# Patient Record
Sex: Female | Born: 1995 | Race: Black or African American | Hispanic: No | Marital: Single | Smoking: Never smoker
Health system: Southern US, Community
[De-identification: ages and names within clinical notes are randomized; demographics above are authoritative.]

---

## 2020-03-14 ENCOUNTER — Ambulatory Visit: Payer: Self-pay | Attending: Internal Medicine

## 2020-03-14 DIAGNOSIS — Z23 Encounter for immunization: Secondary | ICD-10-CM

## 2020-03-14 NOTE — Progress Notes (Signed)
   Covid-19 Vaccination Clinic  Name:  Reola Buckles    MRN: [id number] DOB: [birth year]  03/14/2020  Ms. Bednarczyk was observed post Covid-19 immunization for 15 minutes without incident. She was provided with Vaccine Information Sheet and instruction to access the V-Safe system.   Ms. Brzozowski was instructed to call 911 with any severe reactions post vaccine: Marland Kitchen Difficulty breathing  . Swelling of face and throat  . A fast heartbeat  . A bad rash all over body  . Dizziness and weakness   Immunizations Administered    Name Date Dose VIS Date Route   Pfizer COVID-19 Vaccine 03/14/2020  5:03 PM 0.3 mL 01/19/2019 Intramuscular   Manufacturer: [employer], [employer]   Lot: [id number]   NDC: [address]-2

## 2020-04-05 ENCOUNTER — Ambulatory Visit: Payer: Self-pay | Attending: Internal Medicine

## 2020-04-05 DIAGNOSIS — Z23 Encounter for immunization: Secondary | ICD-10-CM

## 2020-04-05 NOTE — Progress Notes (Signed)
   Covid-19 Vaccination Clinic  Name:  Ashley Rivers    MRN: [id number] DOB: 1996-01-26  04/05/2020  [name] was observed post Covid-19 immunization for 15 minutes without incident. She was provided with Vaccine Information Sheet and instruction to access the V-Safe system.   [name] was instructed to call 911 with any severe reactions post vaccine: Marland Kitchen Difficulty breathing  . Swelling of face and throat  . A fast heartbeat  . A bad rash all over body  . Dizziness and weakness   Immunizations Administered    Name Date Dose VIS Date Route   Pfizer COVID-19 Vaccine 04/05/2020  3:11 PM 0.3 mL 01/19/2019 Intramuscular   Manufacturer: [employer], [employer]   Lot: [id number]   NDC: [address]-8

## 2020-08-18 ENCOUNTER — Emergency Department (HOSPITAL_COMMUNITY)
Admission: EM | Admit: 2020-08-18 | Discharge: 2020-08-19 | Disposition: A | Discharge status: Home / Self Care | Payer: 59 | Attending: Emergency Medicine | Admitting: Emergency Medicine

## 2020-08-18 ENCOUNTER — Encounter (HOSPITAL_COMMUNITY): Payer: Self-pay | Admitting: Emergency Medicine

## 2020-08-18 ENCOUNTER — Other Ambulatory Visit: Payer: Self-pay

## 2020-08-18 DIAGNOSIS — N83202 Unspecified ovarian cyst, left side: Secondary | ICD-10-CM | POA: Insufficient documentation

## 2020-08-18 DIAGNOSIS — N83201 Unspecified ovarian cyst, right side: Secondary | ICD-10-CM

## 2020-08-18 DIAGNOSIS — R8290 Unspecified abnormal findings in urine: Secondary | ICD-10-CM

## 2020-08-18 DIAGNOSIS — R109 Unspecified abdominal pain: Secondary | ICD-10-CM | POA: Diagnosis present

## 2020-08-18 LAB — URINALYSIS, ROUTINE W REFLEX MICROSCOPIC
Bilirubin Urine: NEGATIVE
Glucose, UA: NEGATIVE mg/dL
Hgb urine dipstick: NEGATIVE
Ketones, ur: NEGATIVE mg/dL
Nitrite: NEGATIVE
Protein, ur: NEGATIVE mg/dL
Specific Gravity, Urine: 1.016 (ref 1.005–1.030)
WBC, UA: >50 WBC/hpf — ABNORMAL HIGH (ref 0–5)
pH: 6 (ref 5.0–8.0)

## 2020-08-18 LAB — CBC
HCT: 42 % (ref 36.0–46.0)
Hemoglobin: 13.1 g/dL (ref 12.0–15.0)
MCH: 26.7 pg (ref 26.0–34.0)
MCHC: 31.2 g/dL (ref 30.0–36.0)
MCV: 85.5 fL (ref 80.0–100.0)
Platelets: 254 10*3/uL (ref 150–400)
RBC: 4.91 MIL/uL (ref 3.87–5.11)
RDW: 12.5 % (ref 11.5–15.5)
WBC: 9.7 10*3/uL (ref 4.0–10.5)
nRBC: 0 % (ref 0.0–0.2)

## 2020-08-18 LAB — LIPASE, BLOOD: Lipase: 28 U/L (ref 11–51)

## 2020-08-18 LAB — COMPREHENSIVE METABOLIC PANEL
ALT: 18 U/L (ref 0–44)
AST: 16 U/L (ref 15–41)
Albumin: 3.8 g/dL (ref 3.5–5.0)
Alkaline Phosphatase: 43 U/L (ref 38–126)
Anion gap: 9 (ref 5–15)
BUN: 8 mg/dL (ref 6–20)
CO2: 23 mmol/L (ref 22–32)
Calcium: 9.3 mg/dL (ref 8.9–10.3)
Chloride: 102 mmol/L (ref 98–111)
Creatinine, Ser: 0.68 mg/dL (ref 0.44–1.00)
GFR calc Af Amer: >60 mL/min (ref >60)
GFR calc non Af Amer: >60 mL/min (ref >60)
Glucose, Bld: 113 mg/dL — ABNORMAL HIGH (ref 70–99)
Potassium: 4.1 mmol/L (ref 3.5–5.1)
Sodium: 134 mmol/L — ABNORMAL LOW (ref 135–145)
Total Bilirubin: 0.9 mg/dL (ref 0.3–1.2)
Total Protein: 7.6 g/dL (ref 6.5–8.1)

## 2020-08-18 LAB — I-STAT BETA HCG BLOOD, ED (MC, WL, AP ONLY): I-stat hCG, quantitative: <5 m[IU]/mL (ref <5)

## 2020-08-18 NOTE — ED Triage Notes (Signed)
Pt c/o right side abd pain radiating to her chest, pt  Having some nausea with pain.

## 2020-08-19 ENCOUNTER — Emergency Department (HOSPITAL_COMMUNITY): Payer: 59

## 2020-08-19 MED ORDER — IBUPROFEN 800 MG PO TABS
800.0000 mg | ORAL_TABLET | Freq: Once | ORAL | Status: AC
  Administered 2020-08-19: 800 mg via ORAL
  Filled 2020-08-19: qty 1

## 2020-08-19 MED ORDER — CEPHALEXIN 500 MG PO CAPS: 500.0000 mg | ORAL_CAPSULE | Freq: Two times a day (BID) | ORAL | 0 refills | Status: AC

## 2020-08-19 MED ORDER — CEPHALEXIN 250 MG PO CAPS
500.0000 mg | ORAL_CAPSULE | Freq: Once | ORAL | Status: AC
  Administered 2020-08-19: 500 mg via ORAL
  Filled 2020-08-19: qty 2

## 2020-08-19 MED ORDER — IOHEXOL 300 MG/ML  SOLN
100.0000 mL | Freq: Once | INTRAMUSCULAR | Status: AC | PRN
  Administered 2020-08-19: 100 mL via INTRAVENOUS

## 2020-08-19 NOTE — ED Notes (Signed)
Patient transported to CT scan . 

## 2020-08-19 NOTE — ED Provider Notes (Signed)
[facility] EMERGENCY DEPARTMENT Provider Note   CSN: [id number] Arrival date & time: 08/18/20  1256     History Chief Complaint  Patient presents with  . Abdominal Pain    Ashley Rivers is a 24 y.o. female.  Patient presents to the emergency department with a chief complaint of right sided abdominal pain.  She states pain started 2 days ago.  She reports associated nausea but denies any vomiting.  She denies any dysuria or hematuria.  Denies any fevers or chills.  States that she has felt constipated.  Denies any prior abdominal surgeries.  States that her symptoms are worsened with walking and with movement.  The history is provided by the patient. No language interpreter was used.       History reviewed. No pertinent past medical history.  There are no problems to display for this patient.   History reviewed. No pertinent surgical history.   OB History   No obstetric history on file.     No family history on file.  Social History   Tobacco Use  . Smoking status: Never Smoker  . Smokeless tobacco: Never Used  Substance Use Topics  . Alcohol use: Never  . Drug use: Never    Home Medications Prior to Admission medications   Not on File    Allergies    Patient has no known allergies.  Review of Systems   Review of Systems  All other systems reviewed and are negative.   Physical Exam Updated Vital Signs BP 136/86 (BP Location: Left Arm)   Pulse 100   Temp 98.9 F (37.2 C) (Oral)   Resp 18   Ht 5' (1.524 m)   Wt 43.1 kg   SpO2 100%   BMI 18.55 kg/m   Physical Exam Vitals and nursing note reviewed.  Constitutional:      General: She is not in acute distress.    Appearance: She is well-developed.  HENT:     Head: Normocephalic and atraumatic.  Eyes:     Conjunctiva/sclera: Conjunctivae normal.  Cardiovascular:     Rate and Rhythm: Normal rate and regular rhythm.     Heart sounds: No murmur heard.   Pulmonary:       Effort: Pulmonary effort is normal. No respiratory distress.     Breath sounds: Normal breath sounds.  Abdominal:     Palpations: Abdomen is soft.     Tenderness: There is abdominal tenderness in the right upper quadrant and right lower quadrant.  Musculoskeletal:     Cervical back: Neck supple.  Skin:    General: Skin is warm and dry.  Neurological:     Mental Status: She is alert.     ED Results / Procedures / Treatments   Labs (all labs ordered are listed, but only abnormal results are displayed) Labs Reviewed  COMPREHENSIVE METABOLIC PANEL - Abnormal; Notable for the following components:      Result Value   Sodium 134 (*)    Glucose, Bld 113 (*)    All other components within normal limits  URINALYSIS, ROUTINE W REFLEX MICROSCOPIC - Abnormal; Notable for the following components:   APPearance CLOUDY (*)    Leukocytes,Ua LARGE (*)    WBC, UA >50 (*)    Bacteria, UA MANY (*)    All other components within normal limits  LIPASE, BLOOD  CBC  I-STAT BETA HCG BLOOD, ED ([facility], [facility], [facility] ONLY)    EKG EKG Interpretation  Date/Time:  Friday  August 18 2020 13:00:38 EDT Ventricular Rate:  106 PR Interval:  136 QRS Duration: 78 QT Interval:  344 QTC Calculation: 456 R Axis:   89 Text Interpretation: Sinus tachycardia Cannot rule out Anterior infarct , age undetermined Abnormal ECG Confirmed by Gilda Crease [phone]) on 08/19/2020 12:08:42 AM   Radiology No results found.  Procedures Procedures (including critical care time)  Medications Ordered in ED Medications - No data to display  ED Course  I have reviewed the triage vital signs and the nursing notes.  Pertinent labs & imaging results that were available during my care of the patient were reviewed by me and considered in my medical decision making (see chart for details).    MDM Rules/Calculators/A&P                         This patient complains of right-sided abdominal pain, this involves an  extensive number of treatment options, and is a complaint that carries with it a high risk of complications and morbidity.    Differential Dx Appendicitis, cholecystitis, pyelonephritis, UTI, ectopic pregnancy, TOA  Pertinent Labs I ordered, reviewed, and interpreted labs, which included CBC without leukocytosis, no transaminitis, normal electrolytes, pregnancy test negative, urinalysis notable for greater than 50 white blood cells and large leukocyte esterase  Imaging Interpretation I ordered imaging studies which included CT abdomen/pelvis based on focal right-sided tenderness, which showed right sided involuting ovarian cyst.   Medications I ordered medication keflex and ibuprofen for pain and possible UTI.  Reassessments After the interventions stated above, I reevaluated the patient and found the patient stable for discharge.  Consultants none  Plan DC home with keflex for abnormal UA.  OBGYN f/u for ovarian cyst.    Final Clinical Impression(s) / ED Diagnoses Final diagnoses:  Cyst of right ovary  Abnormal urinalysis    Rx / DC Orders ED Discharge Orders         Ordered    cephALEXin (KEFLEX) 500 MG capsule  2 times daily        08/19/20 0215           Roxy Horseman, PA-C 08/19/20 0217    Gilda Crease, MD 08/20/20 [phone]

## 2021-09-02 IMAGING — CT CT ABD-PELV W/ CM
2 of 4 series · 16 of 46 positions shown, 18 images · IV contrast (Omni 300)
Comparison: None.

CLINICAL DATA: Right lower quadrant abdominal pain.  Nausea

EXAM:
CT ABDOMEN AND PELVIS WITH CONTRAST
TECHNIQUE: Multidetector CT imaging of the abdomen and pelvis was performed
using the standard protocol following bolus administration of
intravenous contrast.
CONTRAST:  75 mL OMNIPAQUE IOHEXOL 300 MG/ML  SOLN

[Series 3: a/p w/ 5mm · axial · 0.67mm/px · z∈[+740,+1110]mm · 13 of 82 slices shown, 15 images]
[im 4/82  soft-tissue]
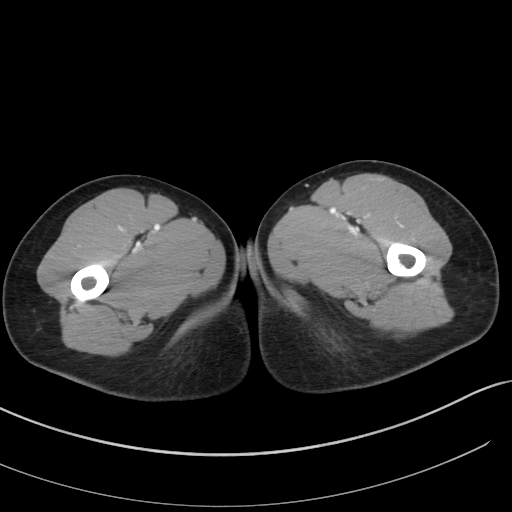
[im 4/82  bone]
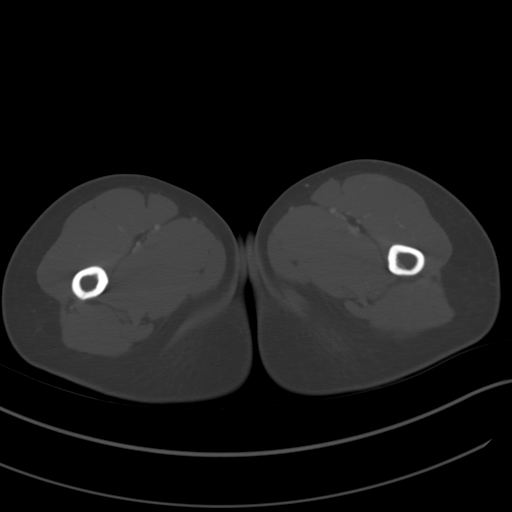
[im 10/82  soft-tissue]
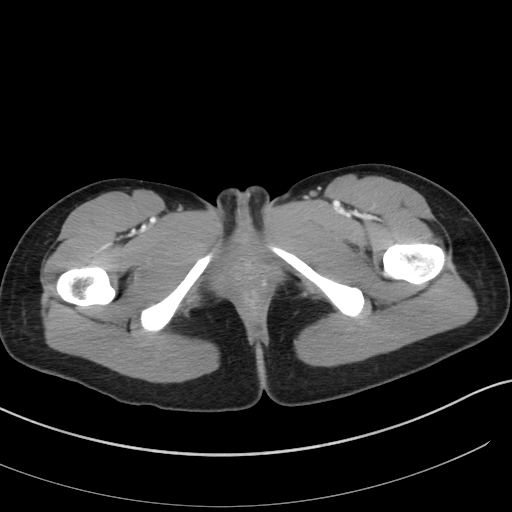
[im 16/82  soft-tissue]
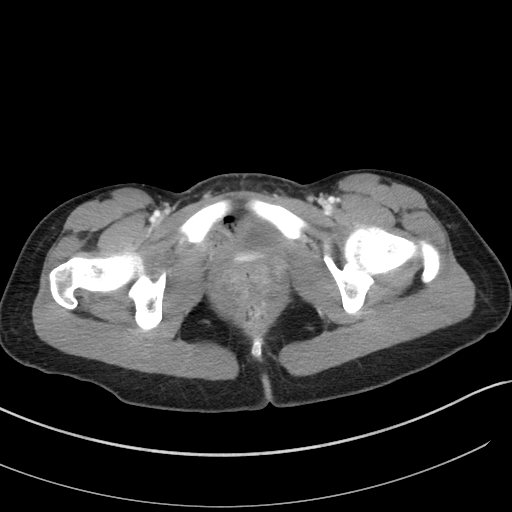
[im 22/82  soft-tissue]
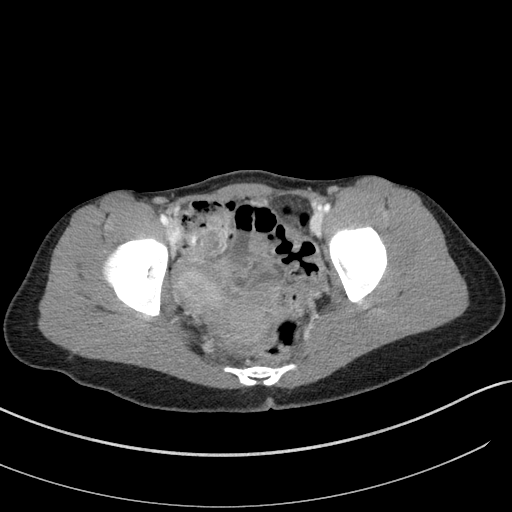
[im 29/82  soft-tissue]
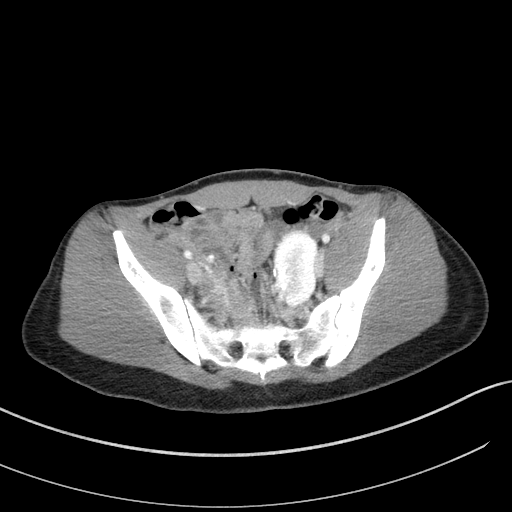
[im 35/82  soft-tissue]
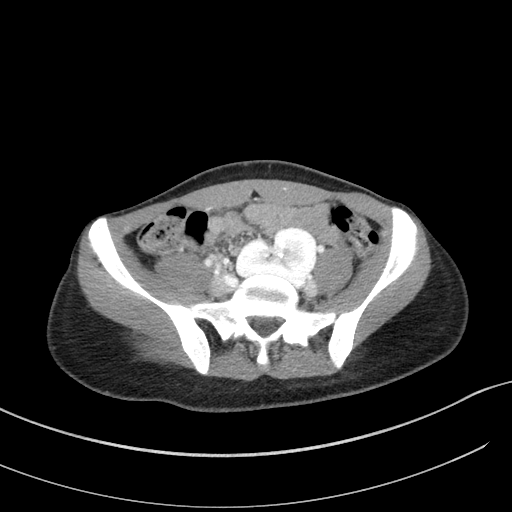
[im 41/82  soft-tissue]
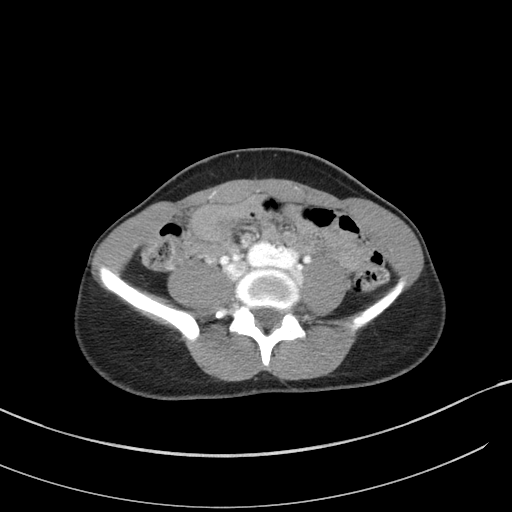
[im 47/82  soft-tissue]
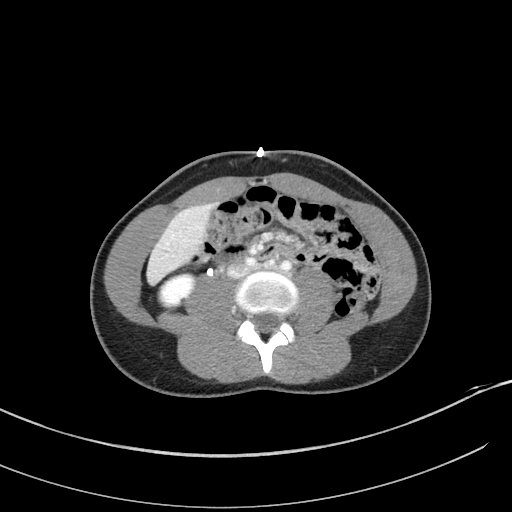
[im 53/82  soft-tissue]
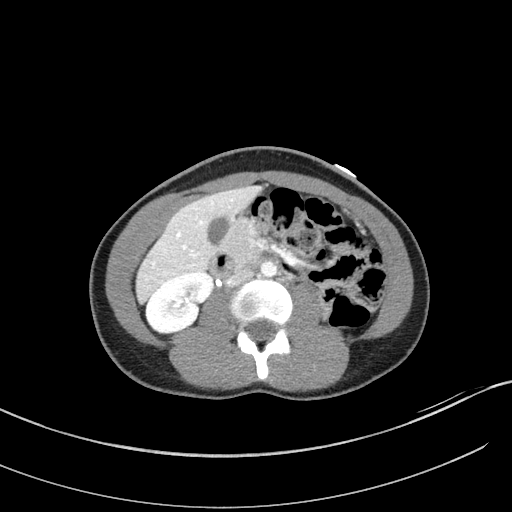
[im 53/82  bone]
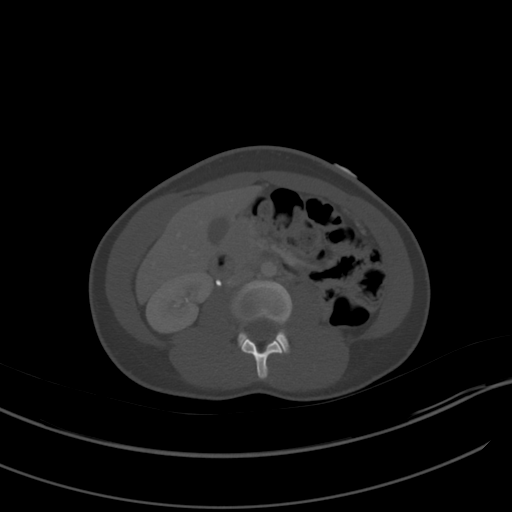
[im 60/82  soft-tissue]
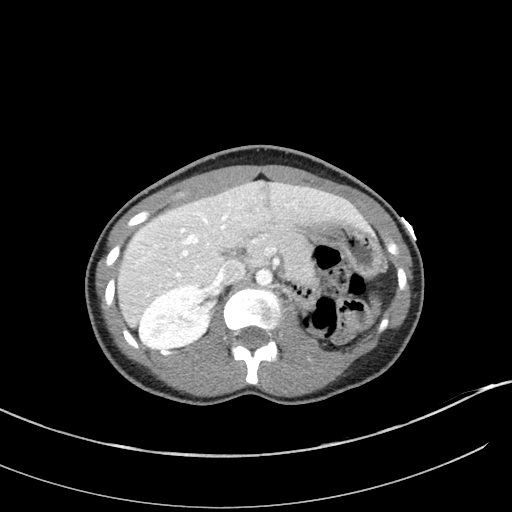
[im 66/82  soft-tissue]
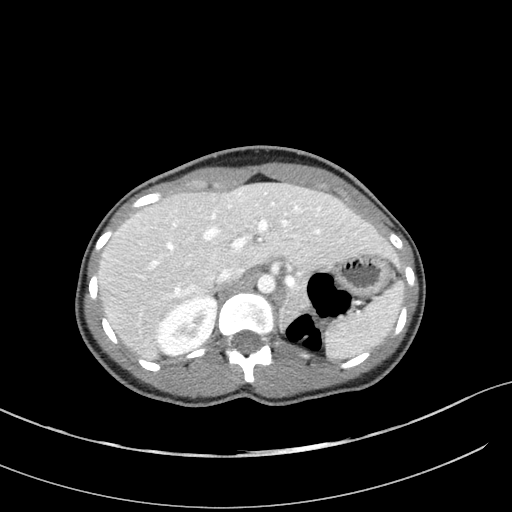
[im 72/82  soft-tissue]
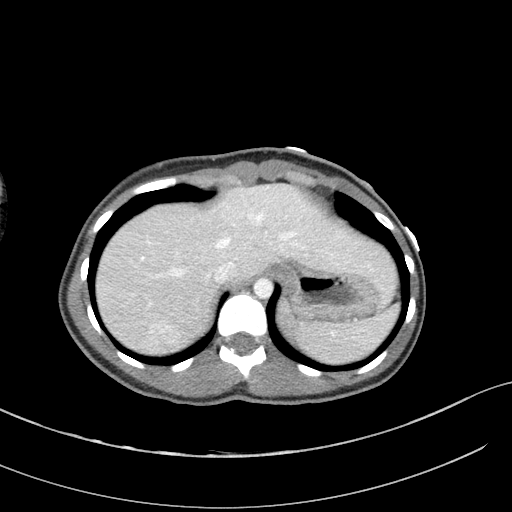
[im 78/82  soft-tissue]
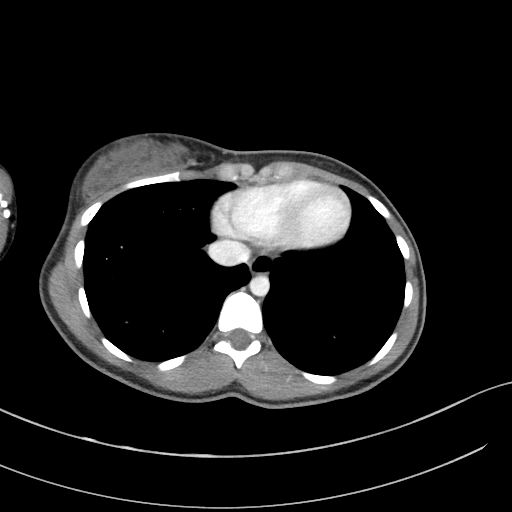

[Series 6: a/p w/ cor · coronal · 0.56mm/px · 3 of 88 slices shown]
[im 30/88  soft-tissue]
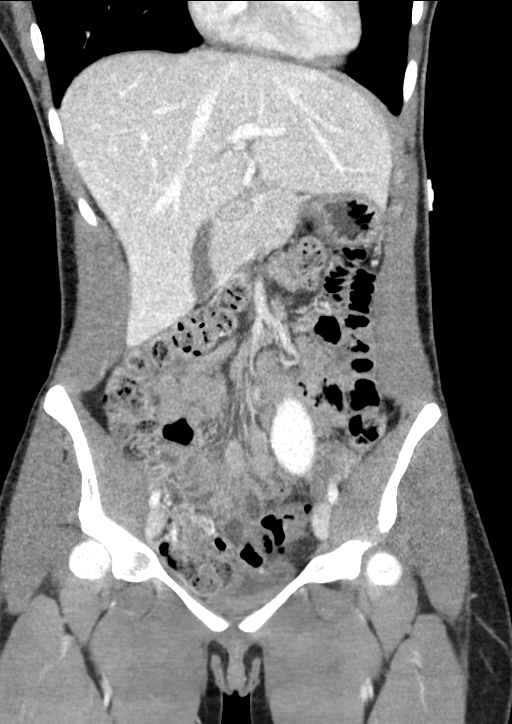
[im 39/88  soft-tissue]
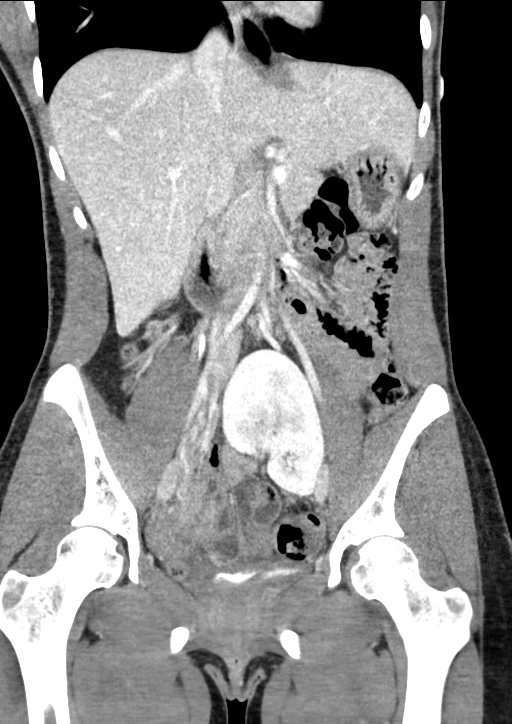
[im 49/88  soft-tissue]
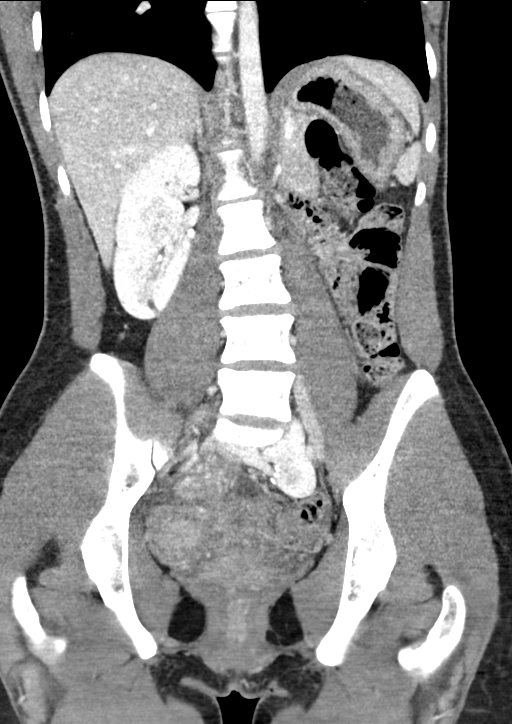

[16 of 46 positions shown; findings below may reference images not displayed]

FINDINGS: Lower chest: Lung bases are clear.

Hepatobiliary: No focal liver abnormality is seen. No gallstones,
gallbladder wall thickening, or biliary dilatation.

Pancreas: Unremarkable. No pancreatic ductal dilatation or
surrounding inflammatory changes.

Spleen: Normal in size without focal abnormality.

Adrenals/Urinary Tract: No adrenal gland nodules. Pelvic location of
the left kidney. Kidneys are otherwise unremarkable. Nephrograms are
symmetrical and homogeneous. Bladder is decompressed but appears
normal.

Stomach/Bowel: Stomach, small bowel, and colon are not abnormally
distended. Stool fills the colon. The appendix is normal.

Vascular/Lymphatic: No significant vascular findings are present. No
enlarged abdominal or pelvic lymph nodes.

Reproductive: The uterus is anteverted and extends towards the left
pelvis. Probable involuting cyst in the right ovary. The left ovary
appears to be pulled towards the right pelvis. Small amount of free
fluid in the pelvis is likely physiologic.

Other: No free air in the abdomen. Abdominal wall musculature
appears intact.

Musculoskeletal: No destructive bone lesions.
IMPRESSION: 1. No acute process demonstrated in the abdomen or pelvis. No
evidence of bowel obstruction or inflammation. Appendix is normal.
2. Pelvic location of the left kidney.
3. Probable involuting cyst in the right ovary. Small amount of free
fluid in the pelvis is likely physiologic.
